# Patient Record
Sex: Female | Born: 1995 | Hispanic: Yes | Marital: Single | Smoking: Former smoker
Health system: Southern US, Community
[De-identification: ages and names within clinical notes are randomized; demographics above are authoritative.]

---
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
## 2016-01-15 ENCOUNTER — Encounter: Payer: BLUE CROSS/BLUE SHIELD | Admitting: Women's Health

## 2016-01-21 ENCOUNTER — Ambulatory Visit: Payer: BLUE CROSS/BLUE SHIELD | Admitting: Gynecology

## 2016-01-22 ENCOUNTER — Ambulatory Visit (INDEPENDENT_AMBULATORY_CARE_PROVIDER_SITE_OTHER): Payer: BLUE CROSS/BLUE SHIELD | Admitting: Gynecology

## 2016-01-22 ENCOUNTER — Encounter: Payer: Self-pay | Admitting: Gynecology

## 2016-01-22 VITALS — BP 112/70

## 2016-01-22 DIAGNOSIS — R350 Frequency of micturition: Secondary | ICD-10-CM | POA: Diagnosis not present

## 2016-01-22 DIAGNOSIS — R300 Dysuria: Secondary | ICD-10-CM

## 2016-01-22 DIAGNOSIS — N3000 Acute cystitis without hematuria: Secondary | ICD-10-CM

## 2016-01-22 DIAGNOSIS — IMO0001 Reserved for inherently not codable concepts without codable children: Secondary | ICD-10-CM

## 2016-01-22 LAB — URINALYSIS W MICROSCOPIC + REFLEX CULTURE
Bilirubin Urine: NEGATIVE
CASTS: NONE SEEN [LPF]
CRYSTALS: NONE SEEN [HPF]
Glucose, UA: NEGATIVE
Ketones, ur: NEGATIVE
Leukocytes, UA: NEGATIVE
Nitrite: NEGATIVE
Specific Gravity, Urine: >1.03 (ref 1.001–1.035)
Yeast: NONE SEEN [HPF]
pH: 6 (ref 5.0–8.0)

## 2016-01-22 MED ORDER — PHENAZOPYRIDINE HCL 200 MG PO TABS: 200.0000 mg | ORAL_TABLET | Freq: Three times a day (TID) | ORAL | Status: DC | PRN

## 2016-01-22 MED ORDER — NITROFURANTOIN MONOHYD MACRO 100 MG PO CAPS: 100.0000 mg | ORAL_CAPSULE | Freq: Two times a day (BID) | ORAL | Status: DC

## 2016-01-22 NOTE — Progress Notes (Signed)
   HPI: 20 year old who for the past few days is reporting frequency in urination but in small quantities. Also accompanied by dysuria. Patient denies any vaginal discharge. Patient denies any back pain, nausea, vomiting, or any GI complaints. Patient sexually active using condoms for contraception reports normal menstrual cycles otherwise.   ROS: A ROS was performed and pertinent positives and negatives are included in the history.  GENERAL: No fevers or chills. HEENT: No change in vision, no earache, sore throat or sinus congestion. NECK: No pain or stiffness. CARDIOVASCULAR: No chest pain or pressure. No palpitations. PULMONARY: No shortness of breath, cough or wheeze. GASTROINTESTINAL: No abdominal pain, nausea, vomiting or diarrhea, melena or bright red blood per rectum. GENITOURINARY: Frequency and dysuria MUSCULOSKELETAL: No joint or muscle pain, no back pain, no recent trauma. DERMATOLOGIC: No rash, no itching, no lesions. ENDOCRINE: No polyuria, polydipsia, no heat or cold intolerance. No recent change in weight. HEMATOLOGICAL: No anemia or easy bruising or bleeding. NEUROLOGIC: No headache, seizures, numbness, tingling or weakness. PSYCHIATRIC: No depression, no loss of interest in normal activity or change in sleep pattern.   PE: Blood pressure 112/70 Gen. appearance well-developed well-nourished female with above-mentioned complaining no acute distress Back: No CVA tenderness Abdomen: Soft nontender no rebound or guarding Pelvic exam not done Rectal exam: Not done  Urinalysis moderate bacteria, 3-10 RBC, 0-5 WBC urine culture submitted   Assessment Plan: 20 year old with clinical signs and symptoms consistent with urinary tract infection she'll be prescribed Macrobid one by mouth twice a day for 7 days. For bladder spasm and dysuria she'll be prescribed Pyridium 200 mg take 1 by mouth 3 times a day for 3 days. Literature information on Macrobid as well as a urinary tract infection) was  provided.    Greater than 50% of time was spent in counseling and coordinating care of this patient. Time of consultation: 15   Minutes.

## 2016-01-22 NOTE — Patient Instructions (Signed)
Phenazopyridine tablets What is this medicine? PHENAZOPYRIDINE (fen az oh PEER i deen) is a pain reliever. It is used to stop the pain, burning, or discomfort caused by infection or irritation of the urinary tract. This medicine is not an antibiotic. It will not cure a urinary tract infection. This medicine may be used for other purposes; ask your health care provider or pharmacist if you have questions. What should I tell my health care provider before I take this medicine? They need to know if you have any of these conditions: -glucose-6-phosphate dehydrogenase (G6PD) deficiency -kidney disease -an unusual or allergic reaction to phenazopyridine, other medicines, foods, dyes, or preservatives -pregnant or trying to get pregnant -breast-feeding How should I use this medicine? Take this medicine by mouth with a glass of water. Follow the directions on the prescription label. Take after meals. Take your doses at regular intervals. Do not take your medicine more often than directed. Do not skip doses or stop your medicine early even if you feel better. Do not stop taking except on your doctor's advice. Talk to your pediatrician regarding the use of this medicine in children. Special care may be needed. Overdosage: If you think you have taken too much of this medicine contact a poison control center or emergency room at once. NOTE: This medicine is only for you. Do not share this medicine with others. What if I miss a dose? If you miss a dose, take it as soon as you can. If it is almost time for your next dose, take only that dose. Do not take double or extra doses. What may interact with this medicine? Interactions are not expected. This list may not describe all possible interactions. Give your health care provider a list of all the medicines, herbs, non-prescription drugs, or dietary supplements you use. Also tell them if you smoke, drink alcohol, or use illegal drugs. Some items may interact  with your medicine. What should I watch for while using this medicine? Tell your doctor or health care professional if your symptoms do not improve or if they get worse. This medicine colors body fluids red. This effect is harmless and will go away after you are done taking the medicine. It will change urine to an dark orange or red color. The red color may stain clothing. Soft contact lenses may become permanently stained. It is best not to wear soft contact lenses while taking this medicine. If you are diabetic you may get a false positive result for sugar in your urine. Talk to your health care provider. What side effects may I notice from receiving this medicine? Side effects that you should report to your doctor or health care professional as soon as possible: -allergic reactions like skin rash, itching or hives, swelling of the face, lips, or tongue -blue or purple color of the skin -difficulty breathing -fever -less urine -unusual bleeding, bruising -unusual tired, weak -vomiting -yellowing of the eyes or skin Side effects that usually do not require medical attention (report to your doctor or health care professional if they continue or are bothersome): -dark urine -headache -stomach upset This list may not describe all possible side effects. Call your doctor for medical advice about side effects. You may report side effects to FDA at 1-800-FDA-1088. Where should I keep my medicine? Keep out of the reach of children. Store at room temperature between 15 and 30 degrees C (59 and 86 degrees F). Protect from light and moisture. Throw away any unused medicine after the   expiration date. NOTE: This sheet is a summary. It may not cover all possible information. If you have questions about this medicine, talk to your doctor, pharmacist, or health care provider.    2016, Elsevier/Gold Standard. (2008-03-30 11:04:07) Nitrofurantoin tablets or capsules What is this medicine? NITROFURANTOIN  (nye troe fyoor AN toyn) is an antibiotic. It is used to treat urinary tract infections. This medicine may be used for other purposes; ask your health care provider or pharmacist if you have questions. What should I tell my health care provider before I take this medicine? They need to know if you have any of these conditions: -anemia -diabetes -glucose-6-phosphate dehydrogenase deficiency -kidney disease -liver disease -lung disease -other chronic illness -an unusual or allergic reaction to nitrofurantoin, other antibiotics, other medicines, foods, dyes or preservatives -pregnant or trying to get pregnant -breast-feeding How should I use this medicine? Take this medicine by mouth with a glass of water. Follow the directions on the prescription label. Take this medicine with food or milk. Take your doses at regular intervals. Do not take your medicine more often than directed. Do not stop taking except on your doctor's advice. Talk to your pediatrician regarding the use of this medicine in children. While this drug may be prescribed for selected conditions, precautions do apply. Overdosage: If you think you have taken too much of this medicine contact a poison control center or emergency room at once. NOTE: This medicine is only for you. Do not share this medicine with others. What if I miss a dose? If you miss a dose, take it as soon as you can. If it is almost time for your next dose, take only that dose. Do not take double or extra doses. What may interact with this medicine? -antacids containing magnesium trisilicate -probenecid -quinolone antibiotics like ciprofloxacin, lomefloxacin, norfloxacin and ofloxacin -sulfinpyrazone This list may not describe all possible interactions. Give your health care provider a list of all the medicines, herbs, non-prescription drugs, or dietary supplements you use. Also tell them if you smoke, drink alcohol, or use illegal drugs. Some items may  interact with your medicine. What should I watch for while using this medicine? Tell your doctor or health care professional if your symptoms do not improve or if you get new symptoms. Drink several glasses of water a day. If you are taking this medicine for a long time, visit your doctor for regular checks on your progress. If you are diabetic, you may get a false positive result for sugar in your urine with certain brands of urine tests. Check with your doctor. What side effects may I notice from receiving this medicine? Side effects that you should report to your doctor or health care professional as soon as possible: -allergic reactions like skin rash or hives, swelling of the face, lips, or tongue -chest pain -cough -difficulty breathing -dizziness, drowsiness -fever or infection -joint aches or pains -pale or blue-tinted skin -redness, blistering, peeling or loosening of the skin, including inside the mouth -tingling, burning, pain, or numbness in hands or feet -unusual bleeding or bruising -unusually weak or tired -yellowing of eyes or skin Side effects that usually do not require medical attention (report to your doctor or health care professional if they continue or are bothersome): -dark urine -diarrhea -headache -loss of appetite -nausea or vomiting -temporary hair loss This list may not describe all possible side effects. Call your doctor for medical advice about side effects. You may report side effects to FDA at 1-800-FDA-1088. Where   should I keep my medicine? Keep out of the reach of children. Store at room temperature between 15 and 30 degrees C (59 and 86 degrees F). Protect from light. Throw away any unused medicine after the expiration date. NOTE: This sheet is a summary. It may not cover all possible information. If you have questions about this medicine, talk to your doctor, pharmacist, or health care provider.    2016, Elsevier/Gold Standard. (2008-03-22  15:56:47) Urinary Tract Infection Urinary tract infections (UTIs) can develop anywhere along your urinary tract. Your urinary tract is your body's drainage system for removing wastes and extra water. Your urinary tract includes two kidneys, two ureters, a bladder, and a urethra. Your kidneys are a pair of bean-shaped organs. Each kidney is about the size of your fist. They are located below your ribs, one on each side of your spine. CAUSES Infections are caused by microbes, which are microscopic organisms, including fungi, viruses, and bacteria. These organisms are so small that they can only be seen through a microscope. Bacteria are the microbes that most commonly cause UTIs. SYMPTOMS  Symptoms of UTIs may vary by age and gender of the patient and by the location of the infection. Symptoms in young women typically include a frequent and intense urge to urinate and a painful, burning feeling in the bladder or urethra during urination. Older women and men are more likely to be tired, shaky, and weak and have muscle aches and abdominal pain. A fever may mean the infection is in your kidneys. Other symptoms of a kidney infection include pain in your back or sides below the ribs, nausea, and vomiting. DIAGNOSIS To diagnose a UTI, your caregiver will ask you about your symptoms. Your caregiver will also ask you to provide a urine sample. The urine sample will be tested for bacteria and white blood cells. White blood cells are made by your body to help fight infection. TREATMENT  Typically, UTIs can be treated with medication. Because most UTIs are caused by a bacterial infection, they usually can be treated with the use of antibiotics. The choice of antibiotic and length of treatment depend on your symptoms and the type of bacteria causing your infection. HOME CARE INSTRUCTIONS  If you were prescribed antibiotics, take them exactly as your caregiver instructs you. Finish the medication even if you feel  better after you have only taken some of the medication.  Drink enough water and fluids to keep your urine clear or pale yellow.  Avoid caffeine, tea, and carbonated beverages. They tend to irritate your bladder.  Empty your bladder often. Avoid holding urine for long periods of time.  Empty your bladder before and after sexual intercourse.  After a bowel movement, women should cleanse from front to back. Use each tissue only once. SEEK MEDICAL CARE IF:   You have back pain.  You develop a fever.  Your symptoms do not begin to resolve within 3 days. SEEK IMMEDIATE MEDICAL CARE IF:   You have severe back pain or lower abdominal pain.  You develop chills.  You have nausea or vomiting.  You have continued burning or discomfort with urination. MAKE SURE YOU:   Understand these instructions.  Will watch your condition.  Will get help right away if you are not doing well or get worse.   This information is not intended to replace advice given to you by your health care provider. Make sure you discuss any questions you have with your health care provider.     Document Released: 06/11/2005 Document Revised: 05/23/2015 Document Reviewed: 10/10/2011 Elsevier Interactive Patient Education 2016 Elsevier Inc.  

## 2016-01-24 LAB — URINE CULTURE
COLONY COUNT: NO GROWTH
Organism ID, Bacteria: NO GROWTH

## 2016-03-03 ENCOUNTER — Encounter: Payer: Self-pay | Admitting: Women's Health

## 2016-06-27 ENCOUNTER — Encounter: Payer: Self-pay | Admitting: Women's Health

## 2016-06-27 ENCOUNTER — Ambulatory Visit (INDEPENDENT_AMBULATORY_CARE_PROVIDER_SITE_OTHER): Payer: BLUE CROSS/BLUE SHIELD | Admitting: Women's Health

## 2016-06-27 VITALS — BP 110/80 | Ht 63.0 in | Wt 94.0 lb

## 2016-06-27 DIAGNOSIS — Z23 Encounter for immunization: Secondary | ICD-10-CM

## 2016-06-27 DIAGNOSIS — N898 Other specified noninflammatory disorders of vagina: Secondary | ICD-10-CM

## 2016-06-27 DIAGNOSIS — B9689 Other specified bacterial agents as the cause of diseases classified elsewhere: Secondary | ICD-10-CM | POA: Diagnosis not present

## 2016-06-27 DIAGNOSIS — N760 Acute vaginitis: Secondary | ICD-10-CM | POA: Diagnosis not present

## 2016-06-27 DIAGNOSIS — Z30011 Encounter for initial prescription of contraceptive pills: Secondary | ICD-10-CM | POA: Diagnosis not present

## 2016-06-27 DIAGNOSIS — Z113 Encounter for screening for infections with a predominantly sexual mode of transmission: Secondary | ICD-10-CM | POA: Diagnosis not present

## 2016-06-27 LAB — WET PREP FOR TRICH, YEAST, CLUE
Trich, Wet Prep: NONE SEEN
WBC, Wet Prep HPF POC: NONE SEEN
Yeast Wet Prep HPF POC: NONE SEEN

## 2016-06-27 MED ORDER — DROSPIRENONE-ETHINYL ESTRADIOL 3-0.02 MG PO TABS: 1.0000 | ORAL_TABLET | Freq: Every day | ORAL | 0 refills | Status: DC

## 2016-06-27 MED ORDER — METRONIDAZOLE 0.75 % VA GEL: VAGINAL | 0 refills | Status: DC

## 2016-06-27 NOTE — Patient Instructions (Signed)

## 2016-06-27 NOTE — Progress Notes (Signed)
Presents with complaint of vaginal discharge with itching and irregular bleeding. Withdrawal, occasional condoms. One partner for 1 year. First gardasil given one year ago. Denies urinary symptoms, abdominal pain or fever. History of positive GC/Chlamydia with negative test of cure. History of anxiety, moved [place] from [place] was living with in and now living with her boyfriend's family.  Exam: Appears well. External genitalia within normal limits, speculum exam scant bloody discharge, GC/Chlamydia culture taken. Wet prep positive for moderate clues, TNTC bacteria. Bimanual uterus small, no CMT or adnexal tenderness.  Bacteria vaginosis Contraception management STD screen  Plan: MetroGel vaginal cream 1 applicator at bedtime 5, alcohol precautions reviewed, instructed to call if continued discharge. GC/Chlamydia culture taken, HIV, hep B, C, RPR. Second gardasil given instructed to return to office in 3 months for third Gardasil and annual exam. Contraception options reviewed, Yaz prescription, slight risk for blood clots and strokes reviewed. Start up instructions reviewed, continue condoms/withdrawal until after first month.

## 2016-06-28 LAB — HEPATITIS B SURFACE ANTIGEN: Hepatitis B Surface Ag: NEGATIVE

## 2016-06-28 LAB — RPR

## 2016-06-28 LAB — GC/CHLAMYDIA PROBE AMP
CT Probe RNA: NOT DETECTED
GC Probe RNA: NOT DETECTED

## 2016-06-28 LAB — HEPATITIS C ANTIBODY: HCV AB: NEGATIVE

## 2016-06-28 LAB — HIV ANTIBODY (ROUTINE TESTING W REFLEX): HIV 1&2 Ab, 4th Generation: NONREACTIVE

## 2016-07-01 ENCOUNTER — Telehealth: Payer: Self-pay | Admitting: *Deleted

## 2016-07-01 NOTE — Telephone Encounter (Signed)
Pt informed with the negative STD results on 06/27/16

## 2016-07-11 ENCOUNTER — Encounter: Payer: BLUE CROSS/BLUE SHIELD | Admitting: Women's Health

## 2016-09-02 ENCOUNTER — Encounter: Payer: BLUE CROSS/BLUE SHIELD | Admitting: Women's Health

## 2017-01-28 ENCOUNTER — Encounter: Payer: Self-pay | Admitting: Gynecology

## 2017-06-22 ENCOUNTER — Encounter: Payer: BLUE CROSS/BLUE SHIELD | Admitting: Women's Health

## 2017-06-22 DIAGNOSIS — Z0289 Encounter for other administrative examinations: Secondary | ICD-10-CM

## 2017-10-30 ENCOUNTER — Encounter: Payer: Self-pay | Admitting: Gynecology

## 2017-10-30 ENCOUNTER — Ambulatory Visit (INDEPENDENT_AMBULATORY_CARE_PROVIDER_SITE_OTHER): Payer: BLUE CROSS/BLUE SHIELD | Admitting: Gynecology

## 2017-10-30 VITALS — BP 116/74

## 2017-10-30 DIAGNOSIS — Z3009 Encounter for other general counseling and advice on contraception: Secondary | ICD-10-CM | POA: Diagnosis not present

## 2017-10-30 DIAGNOSIS — N760 Acute vaginitis: Secondary | ICD-10-CM | POA: Diagnosis not present

## 2017-10-30 DIAGNOSIS — B9689 Other specified bacterial agents as the cause of diseases classified elsewhere: Secondary | ICD-10-CM | POA: Diagnosis not present

## 2017-10-30 LAB — WET PREP FOR TRICH, YEAST, CLUE

## 2017-10-30 LAB — PREGNANCY, URINE: Preg Test, Ur: NEGATIVE

## 2017-10-30 MED ORDER — CLINDAMYCIN PHOSPHATE 2 % VA CREA: 1.0000 | TOPICAL_CREAM | Freq: Every day | VAGINAL | 0 refills | Status: DC

## 2017-10-30 NOTE — Addendum Note (Signed)
Addended by: Dayna BarkerGARDNER, KIMBERLY K on: 10/30/2017 12:52 PM   Modules accepted: Orders

## 2017-10-30 NOTE — Progress Notes (Signed)
    Destiny Webb 1996/07/31 [id number]        22 y.o.  G0P0000 presents complaining of vaginal discharge, irritation and odor.  Feels that she has bacterial vaginosis.  Symptoms have been going on for about 2 months and seem to be getting worse.  No urinary symptoms such as frequency dysuria urgency low back pain fever or chills.  No nausea vomiting diarrhea constipation.  Offered STD screening and this was declined.  Past medical history,surgical history, problem list, medications, allergies, family history and social history were all reviewed and documented in the EPIC chart.  Directed ROS with pertinent positives and negatives documented in the history of present illness/assessment and plan.  Exam: Kennon PortelaKim Gardner assistant Vitals:   10/30/17 1228  BP: 116/74   General appearance:  Normal Abdomen soft nontender without masses guarding rebound Pelvic external BUS vagina with white discharge.  Cervix normal.  Uterus normal size midline mobile nontender.  Adnexa without masses or tenderness  Assessment/Plan:  21 y.o. G0P0000 with history and exam as above.  Is due for menses in the next day or 2.  UPT was run today and negative.  Currently not using contraception.  Wet prep consistent with bacterial vaginosis.  Options for treatment reviewed and she elects for Cleocin vaginal cream nightly x7 nights.  We will follow-up if her symptoms persist, worsen or recur.  We discussed contraceptive options and she is leaning towards Mirena IUD.  I reviewed the insertional process with her risks versus benefits and side effect profile.  Literature was provided and she is going to review this and follow-up with her decision.  Greater than 50% of my time was spent in direct face to face counseling and coordination of care with the patient.     Dara Lordsimothy P Kuron Docken MD, 12:37 PM 10/30/2017

## 2017-10-30 NOTE — Patient Instructions (Signed)
Use the vaginal cream nightly for 7 nights.  Follow-up if your discharge or irritation continues.  Follow-up with your contraceptive decision.

## 2017-11-18 ENCOUNTER — Encounter: Payer: BLUE CROSS/BLUE SHIELD | Admitting: Women's Health

## 2018-01-27 ENCOUNTER — Encounter: Payer: Self-pay | Admitting: Women's Health

## 2018-01-27 ENCOUNTER — Ambulatory Visit (INDEPENDENT_AMBULATORY_CARE_PROVIDER_SITE_OTHER): Payer: BLUE CROSS/BLUE SHIELD | Admitting: Women's Health

## 2018-01-27 VITALS — BP 118/70 | Ht 63.0 in | Wt 104.0 lb

## 2018-01-27 DIAGNOSIS — Z23 Encounter for immunization: Secondary | ICD-10-CM | POA: Diagnosis not present

## 2018-01-27 DIAGNOSIS — N898 Other specified noninflammatory disorders of vagina: Secondary | ICD-10-CM | POA: Diagnosis not present

## 2018-01-27 DIAGNOSIS — Z01419 Encounter for gynecological examination (general) (routine) without abnormal findings: Secondary | ICD-10-CM

## 2018-01-27 DIAGNOSIS — Z113 Encounter for screening for infections with a predominantly sexual mode of transmission: Secondary | ICD-10-CM | POA: Diagnosis not present

## 2018-01-27 LAB — WET PREP FOR TRICH, YEAST, CLUE

## 2018-01-27 MED ORDER — ETONOGESTREL-ETHINYL ESTRADIOL 0.12-0.015 MG/24HR VA RING: VAGINAL_RING | VAGINAL | 4 refills | Status: DC

## 2018-01-27 NOTE — Progress Notes (Signed)
Destiny Webb Junio 02-Oct-1995 [id number]    History:    Presents for annual exam.  Regular monthly cycle/withdrawal same partner for 3 years.  History of positive GC and chlamydia in 01-22-2015.  History of substance abuse but reports occasional smoking weed only now.  Received 2 Gardasil in the past.  Past medical history, past surgical history, family history and social history were all reviewed and documented in the EPIC chart.  Moved here from [place] currently living with her aunt who is moving back to [place].  Boyfriend is living at the home.  Father died 2015/01/22.  Quit high school.  ROS:  A ROS was performed and pertinent positives and negatives are included.  Exam:  Vitals:   01/27/18 1057  BP: 118/70  Weight: 104 lb (47.2 kg)  Height:  (1.6 m)   Body mass index is 18.42 kg/m.   General appearance:  Normal Thyroid:  Symmetrical, normal in size, without palpable masses or nodularity. Respiratory  Auscultation:  Clear without wheezing or rhonchi Cardiovascular  Auscultation:  Regular rate, without rubs, murmurs or gallops  Edema/varicosities:  Not grossly evident Abdominal  Soft,nontender, without masses, guarding or rebound.  Liver/spleen:  No organomegaly noted  Hernia:  None appreciated  Skin  Inspection:  Grossly normal   Breasts: Examined lying and sitting.     Right: Without masses, retractions, discharge or axillary adenopathy.     Left: Without masses, retractions, discharge or axillary adenopathy. Gentitourinary   Inguinal/mons:  Normal without inguinal adenopathy  External genitalia:  Normal  BUS/Urethra/Skene's glands:  Normal  Vagina:  Normal  Cervix:  Normal  Uterus: normal in size, shape and contour.  Midline and mobile  Adnexa/parametria:     Rt: Without masses or tenderness.   Lt: Without masses or tenderness.  Anus and perineum: Normal    Assessment/Plan:  22 y.o. S WF G0 for annual exam with no complaints.  Contraception management History  of positive GC/chlamydia.  Plan: Third Gardasil given.  Contraception options reviewed, will try NuvaRing prescription, proper use, start up instructions reviewed.  Reviewed importance of continuing withdrawal with first month on NuvaRing.  SBEs, exercise, calcium rich foods, MVI daily encouraged.  Reviewed importance of no unprescribed drugs.  Safe driving and lifestyle reviewed.  Encouraged to pursue GED.  CBC, GC/chlamydia, denies need for STD screen.    Harrington Challenger [facility], 1:14 PM 01/27/2018

## 2018-01-27 NOTE — Patient Instructions (Signed)
Health Maintenance, Female Adopting a healthy lifestyle and getting preventive care can go a long way to promote health and wellness. Talk with your health care provider about what schedule of regular examinations is right for you. This is a good chance for you to check in with your provider about disease prevention and staying healthy. In between checkups, there are plenty of things you can do on your own. Experts have done a lot of research about which lifestyle changes and preventive measures are most likely to keep you healthy. Ask your health care provider for more information. Weight and diet Eat a healthy diet  Be sure to include plenty of vegetables, fruits, low-fat dairy products, and lean protein.  Do not eat a lot of foods high in solid fats, added sugars, or salt.  Get regular exercise. This is one of the most important things you can do for your health. ? Most adults should exercise for at least 150 minutes each week. The exercise should increase your heart rate and make you sweat (moderate-intensity exercise). ? Most adults should also do strengthening exercises at least twice a week. This is in addition to the moderate-intensity exercise.  Maintain a healthy weight  Body mass index (BMI) is a measurement that can be used to identify possible weight problems. It estimates body fat based on height and weight. Your health care provider can help determine your BMI and help you achieve or maintain a healthy weight.  For females 20 years of age and older: ? A BMI below 18.5 is considered underweight. ? A BMI of 18.5 to 24.9 is normal. ? A BMI of 25 to 29.9 is considered overweight. ? A BMI of 30 and above is considered obese.  Watch levels of cholesterol and blood lipids  You should start having your blood tested for lipids and cholesterol at 20 years of age, then have this test every 5 years.  You may need to have your cholesterol levels checked more often if: ? Your lipid or  cholesterol levels are high. ? You are older than 21 years of age. ? You are at high risk for heart disease.  Cancer screening Lung Cancer  Lung cancer screening is recommended for adults 55-80 years old who are at high risk for lung cancer because of a history of smoking.  A yearly low-dose CT scan of the lungs is recommended for people who: ? Currently smoke. ? Have quit within the past 15 years. ? Have at least a 30-pack-year history of smoking. A pack year is smoking an average of one pack of cigarettes a day for 1 year.  Yearly screening should continue until it has been 15 years since you quit.  Yearly screening should stop if you develop a health problem that would prevent you from having lung cancer treatment.  Breast Cancer  Practice breast self-awareness. This means understanding how your breasts normally appear and feel.  It also means doing regular breast self-exams. Let your health care provider know about any changes, no matter how small.  If you are in your 20s or 30s, you should have a clinical breast exam (CBE) by a health care provider every 1-3 years as part of a regular health exam.  If you are 40 or older, have a CBE every year. Also consider having a breast X-ray (mammogram) every year.  If you have a family history of breast cancer, talk to your health care provider about genetic screening.  If you are at high risk   for breast cancer, talk to your health care provider about having an MRI and a mammogram every year.  Breast cancer gene (BRCA) assessment is recommended for women who have family members with BRCA-related cancers. BRCA-related cancers include: ? Breast. ? Ovarian. ? Tubal. ? Peritoneal cancers.  Results of the assessment will determine the need for genetic counseling and BRCA1 and BRCA2 testing.  Cervical Cancer Your health care provider may recommend that you be screened regularly for cancer of the pelvic organs (ovaries, uterus, and  vagina). This screening involves a pelvic examination, including checking for microscopic changes to the surface of your cervix (Pap test). You may be encouraged to have this screening done every 3 years, beginning at age 22.  For women ages 56-65, health care providers may recommend pelvic exams and Pap testing every 3 years, or they may recommend the Pap and pelvic exam, combined with testing for human papilloma virus (HPV), every 5 years. Some types of HPV increase your risk of cervical cancer. Testing for HPV may also be done on women of any age with unclear Pap test results.  Other health care providers may not recommend any screening for nonpregnant women who are considered low risk for pelvic cancer and who do not have symptoms. Ask your health care provider if a screening pelvic exam is right for you.  If you have had past treatment for cervical cancer or a condition that could lead to cancer, you need Pap tests and screening for cancer for at least 20 years after your treatment. If Pap tests have been discontinued, your risk factors (such as having a new sexual partner) need to be reassessed to determine if screening should resume. Some women have medical problems that increase the chance of getting cervical cancer. In these cases, your health care provider may recommend more frequent screening and Pap tests.  Colorectal Cancer  This type of cancer can be detected and often prevented.  Routine colorectal cancer screening usually begins at 21 years of age and continues through 21 years of age.  Your health care provider may recommend screening at an earlier age if you have risk factors for colon cancer.  Your health care provider may also recommend using home test kits to check for hidden blood in the stool.  A small camera at the end of a tube can be used to examine your colon directly (sigmoidoscopy or colonoscopy). This is done to check for the earliest forms of colorectal  cancer.  Routine screening usually begins at age 33.  Direct examination of the colon should be repeated every 5-10 years through 21 years of age. However, you may need to be screened more often if early forms of precancerous polyps or small growths are found.  Skin Cancer  Check your skin from head to toe regularly.  Tell your health care provider about any new moles or changes in moles, especially if there is a change in a mole's shape or color.  Also tell your health care provider if you have a mole that is larger than the size of a pencil eraser.  Always use sunscreen. Apply sunscreen liberally and repeatedly throughout the day.  Protect yourself by wearing long sleeves, pants, a wide-brimmed hat, and sunglasses whenever you are outside.  Heart disease, diabetes, and high blood pressure  High blood pressure causes heart disease and increases the risk of stroke. High blood pressure is more likely to develop in: ? People who have blood pressure in the high end of  the normal range (130-139/85-89 mm Hg). ? People who are overweight or obese. ? People who are African American.  If you are 21-29 years of age, have your blood pressure checked every 3-5 years. If you are 3 years of age or older, have your blood pressure checked every year. You should have your blood pressure measured twice-once when you are at a hospital or clinic, and once when you are not at a hospital or clinic. Record the average of the two measurements. To check your blood pressure when you are not at a hospital or clinic, you can use: ? An automated blood pressure machine at a pharmacy. ? A home blood pressure monitor.  If you are between 17 years and 37 years old, ask your health care provider if you should take aspirin to prevent strokes.  Have regular diabetes screenings. This involves taking a blood sample to check your fasting blood sugar level. ? If you are at a normal weight and have a low risk for diabetes,  have this test once every three years after 21 years of age. ? If you are overweight and have a high risk for diabetes, consider being tested at a younger age or more often. Preventing infection Hepatitis B  If you have a higher risk for hepatitis B, you should be screened for this virus. You are considered at high risk for hepatitis B if: ? You were born in a country where hepatitis B is common. Ask your health care provider which countries are considered high risk. ? Your parents were born in a high-risk country, and you have not been immunized against hepatitis B (hepatitis B vaccine). ? You have HIV or AIDS. ? You use needles to inject street drugs. ? You live with someone who has hepatitis B. ? You have had sex with someone who has hepatitis B. ? You get hemodialysis treatment. ? You take certain medicines for conditions, including cancer, organ transplantation, and autoimmune conditions.  Hepatitis C  Blood testing is recommended for: ? Everyone born from [birth year] through 1964. ? Anyone with known risk factors for hepatitis C.  Sexually transmitted infections (STIs)  You should be screened for sexually transmitted infections (STIs) including gonorrhea and chlamydia if: ? You are sexually active and are younger than 21 years of age. ? You are older than 21 years of age and your health care provider tells you that you are at risk for this type of infection. ? Your sexual activity has changed since you were last screened and you are at an increased risk for chlamydia or gonorrhea. Ask your health care provider if you are at risk.  If you do not have HIV, but are at risk, it may be recommended that you take a prescription medicine daily to prevent HIV infection. This is called pre-exposure prophylaxis (PrEP). You are considered at risk if: ? You are sexually active and do not regularly use condoms or know the HIV status of your partner(s). ? You take drugs by injection. ? You are  sexually active with a partner who has HIV.  Talk with your health care provider about whether you are at high risk of being infected with HIV. If you choose to begin PrEP, you should first be tested for HIV. You should then be tested every 3 months for as long as you are taking PrEP. Pregnancy  If you are premenopausal and you may become pregnant, ask your health care provider about preconception counseling.  If you may become  pregnant, take 400 to 800 micrograms (mcg) of folic acid every day.  If you want to prevent pregnancy, talk to your health care provider about birth control (contraception). Osteoporosis and menopause  Osteoporosis is a disease in which the bones lose minerals and strength with aging. This can result in serious bone fractures. Your risk for osteoporosis can be identified using a bone density scan.  If you are 65 years of age or older, or if you are at risk for osteoporosis and fractures, ask your health care provider if you should be screened.  Ask your health care provider whether you should take a calcium or vitamin D supplement to lower your risk for osteoporosis.  Menopause may have certain physical symptoms and risks.  Hormone replacement therapy may reduce some of these symptoms and risks. Talk to your health care provider about whether hormone replacement therapy is right for you. Follow these instructions at home:  Schedule regular health, dental, and eye exams.  Stay current with your immunizations.  Do not use any tobacco products including cigarettes, chewing tobacco, or electronic cigarettes.  If you are pregnant, do not drink alcohol.  If you are breastfeeding, limit how much and how often you drink alcohol.  Limit alcohol intake to no more than 1 drink per day for nonpregnant women. One drink equals 12 ounces of beer, 5 ounces of wine, or 1 ounces of hard liquor.  Do not use street drugs.  Do not share needles.  Ask your health care  provider for help if you need support or information about quitting drugs.  Tell your health care provider if you often feel depressed.  Tell your health care provider if you have ever been abused or do not feel safe at home. This information is not intended to replace advice given to you by your health care provider. Make sure you discuss any questions you have with your health care provider. Document Released: 03/17/2011 Document Revised: 02/07/2016 Document Reviewed: 06/05/2015 Elsevier Interactive Patient Education  2018 Elsevier Inc.   Ethinyl Estradiol; Etonogestrel vaginal ring What is this medicine? ETHINYL ESTRADIOL; ETONOGESTREL (ETH in il es tra DYE ole; et oh noe JES trel) vaginal ring is a flexible, vaginal ring used as a contraceptive (birth control method). This medicine combines two types of female hormones, an estrogen and a progestin. This ring is used to prevent ovulation and pregnancy. Each ring is effective for one month. This medicine may be used for other purposes; ask your health care provider or pharmacist if you have questions. COMMON BRAND NAME(S): NuvaRing What should I tell my health care provider before I take this medicine? They need to know if you have or ever had any of these conditions: -abnormal vaginal bleeding -blood vessel disease or blood clots -breast, cervical, endometrial, ovarian, liver, or uterine cancer -diabetes -gallbladder disease -heart disease or recent heart attack -high blood pressure -high cholesterol -kidney disease -liver disease -migraine headaches -stroke -systemic lupus erythematosus (SLE) -tobacco smoker -an unusual or allergic reaction to estrogens, progestins, other medicines, foods, dyes, or preservatives -pregnant or trying to get pregnant -breast-feeding How should I use this medicine? Insert the ring into your vagina as directed. Follow the directions on the prescription label. The ring will remain place for 3 weeks  and is then removed for a 1-week break. A new ring is inserted 1 week after the last ring was removed, on the same day of the week. Check often to make sure the ring is still in place,   and after sexual intercourse. If the ring was out of the vagina for an unknown amount of time, you may not be protected from pregnancy. Perform a pregnancy test and call your doctor. Do not use more often than directed. A patient package insert for the product will be given with each prescription and refill. Read this sheet carefully each time. The sheet may change frequently. Contact your pediatrician regarding the use of this medicine in children. Special care may be needed. This medicine has been used in female children who have started having menstrual periods. Overdosage: If you think you have taken too much of this medicine contact a poison control center or emergency room at once. NOTE: This medicine is only for you. Do not share this medicine with others. What if I miss a dose? You will need to replace your vaginal ring once a month as directed. If the ring should slip out, or if you leave it in longer or shorter than you should, contact your health care professional for advice. What may interact with this medicine? Do not take this medicine with the following medication: -dasabuvir; ombitasvir; paritaprevir; ritonavir -ombitasvir; paritaprevir; ritonavir This medicine may also interact with the following medications: -acetaminophen -antibiotics or medicines for infections, especially rifampin, rifabutin, rifapentine, and griseofulvin, and possibly penicillins or tetracyclines -aprepitant -ascorbic acid (vitamin C) -atorvastatin -barbiturate medicines, such as phenobarbital -bosentan -carbamazepine -caffeine -clofibrate -cyclosporine -dantrolene -doxercalciferol -felbamate -grapefruit juice -hydrocortisone -medicines for anxiety or sleeping problems, such as diazepam or  temazepam -medicines for diabetes, including pioglitazone -modafinil -mycophenolate -nefazodone -oxcarbazepine -phenytoin -prednisolone -ritonavir or other medicines for HIV infection or AIDS -rosuvastatin -selegiline -soy isoflavones supplements -St. John's wort -tamoxifen or raloxifene -theophylline -thyroid hormones -topiramate -warfarin This list may not describe all possible interactions. Give your health care provider a list of all the medicines, herbs, non-prescription drugs, or dietary supplements you use. Also tell them if you smoke, drink alcohol, or use illegal drugs. Some items may interact with your medicine. What should I watch for while using this medicine? Visit your doctor or health care professional for regular checks on your progress. You will need a regular breast and pelvic exam and Pap smear while on this medicine. Use an additional method of contraception during the first cycle that you use this ring. Do not use a diaphragm or female condom, as the ring can interfere with these birth control methods and their proper placement. If you have any reason to think you are pregnant, stop using this medicine right away and contact your doctor or health care professional. If you are using this medicine for hormone related problems, it may take several cycles of use to see improvement in your condition. Smoking increases the risk of getting a blood clot or having a stroke while you are using hormonal birth control, especially if you are more than 21 years old. You are strongly advised not to smoke. This medicine can make your body retain fluid, making your fingers, hands, or ankles swell. Your blood pressure can go up. Contact your doctor or health care professional if you feel you are retaining fluid. This medicine can make you more sensitive to the sun. Keep out of the sun. If you cannot avoid being in the sun, wear protective clothing and use sunscreen. Do not use sun lamps  or tanning beds/booths. If you wear contact lenses and notice visual changes, or if the lenses begin to feel uncomfortable, consult your eye care specialist. In some women, tenderness, swelling, or minor  or minor bleeding of the gums may occur. Notify your dentist if this happens. Brushing and flossing your teeth regularly may help limit this. See your dentist regularly and inform your dentist of the medicines you are taking. If you are going to have elective surgery, you may need to stop using this medicine before the surgery. Consult your health care professional for advice. This medicine does not protect you against HIV infection (AIDS) or any other sexually transmitted diseases. What side effects may I notice from receiving this medicine? Side effects that you should report to your doctor or health care professional as soon as possible: -breast tissue changes or discharge -changes in vaginal bleeding during your period or between your periods -chest pain -coughing up blood -dizziness or fainting spells -headaches or migraines -leg, arm or groin pain -severe or sudden headaches -stomach pain (severe) -sudden shortness of breath -sudden loss of coordination, especially on one side of the body -speech problems -symptoms of vaginal infection like itching, irritation or unusual discharge -tenderness in the upper abdomen -vomiting -weakness or numbness in the arms or legs, especially on one side of the body -yellowing of the eyes or skin Side effects that usually do not require medical attention (report to your doctor or health care professional if they continue or are bothersome): -breakthrough bleeding and spotting that continues beyond the 3 initial cycles of pills -breast tenderness -mood changes, anxiety, depression, frustration, anger, or emotional outbursts -increased sensitivity to sun or ultraviolet light -nausea -skin rash, acne, or brown spots on the skin -weight gain (slight) This  list may not describe all possible side effects. Call your doctor for medical advice about side effects. You may report side effects to FDA at 1-800-FDA-1088. Where should I keep my medicine? Keep out of the reach of children. Store at room temperature between 15 and 30 degrees C (59 and 86 degrees F) for up to 4 months. The product will expire after 4 months. Protect from light. Throw away any unused medicine after the expiration date. NOTE: This sheet is a summary. It may not cover all possible information. If you have questions about this medicine, talk to your doctor, pharmacist, or health care provider.  2018 Elsevier/Gold Standard (2016-05-09 17:00:31)  

## 2018-01-28 LAB — C. TRACHOMATIS/N. GONORRHOEAE RNA
C. trachomatis RNA, TMA: NOT DETECTED
N. gonorrhoeae RNA, TMA: NOT DETECTED

## 2018-04-30 ENCOUNTER — Ambulatory Visit (HOSPITAL_COMMUNITY)
Admission: EM | Admit: 2018-04-30 | Discharge: 2018-04-30 | Disposition: A | Discharge status: Home / Self Care | Payer: BLUE CROSS/BLUE SHIELD | Attending: Family Medicine | Admitting: Family Medicine

## 2018-04-30 ENCOUNTER — Ambulatory Visit (INDEPENDENT_AMBULATORY_CARE_PROVIDER_SITE_OTHER): Payer: BLUE CROSS/BLUE SHIELD

## 2018-04-30 ENCOUNTER — Encounter (HOSPITAL_COMMUNITY): Payer: Self-pay | Admitting: Family Medicine

## 2018-04-30 DIAGNOSIS — S92515A Nondisplaced fracture of proximal phalanx of left lesser toe(s), initial encounter for closed fracture: Secondary | ICD-10-CM

## 2018-04-30 MED ORDER — HYDROCODONE-ACETAMINOPHEN 5-325 MG PO TABS: 1.0000 | ORAL_TABLET | Freq: Four times a day (QID) | ORAL | 0 refills | Status: DC | PRN

## 2018-04-30 NOTE — ED Triage Notes (Signed)
Pt presents with left toe pain from jamming it in the car door .

## 2018-04-30 NOTE — Discharge Instructions (Signed)
Call orthopedics on Monday for appointment and follow up
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
## 2018-11-25 ENCOUNTER — Ambulatory Visit (HOSPITAL_COMMUNITY)
Admission: EM | Admit: 2018-11-25 | Discharge: 2018-11-25 | Disposition: A | Discharge status: Home / Self Care | Payer: BLUE CROSS/BLUE SHIELD | Attending: Family Medicine | Admitting: Family Medicine

## 2018-11-25 ENCOUNTER — Encounter (HOSPITAL_COMMUNITY): Payer: Self-pay | Admitting: Family Medicine

## 2018-11-25 ENCOUNTER — Other Ambulatory Visit: Payer: Self-pay

## 2018-11-25 DIAGNOSIS — S00439A Contusion of unspecified ear, initial encounter: Secondary | ICD-10-CM

## 2018-11-25 MED ORDER — DICLOFENAC SODIUM 75 MG PO TBEC: 75.0000 mg | DELAYED_RELEASE_TABLET | Freq: Two times a day (BID) | ORAL | 0 refills | Status: AC
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
## 2019-04-03 IMAGING — DX DG FOOT COMPLETE 3+V*L*
3 series · 3 of 3 positions shown · non-contrast
Comparison: None.

CLINICAL DATA: Injury to the left foot with pain

EXAM:
LEFT FOOT - COMPLETE 3+ VIEW

[foot ap]
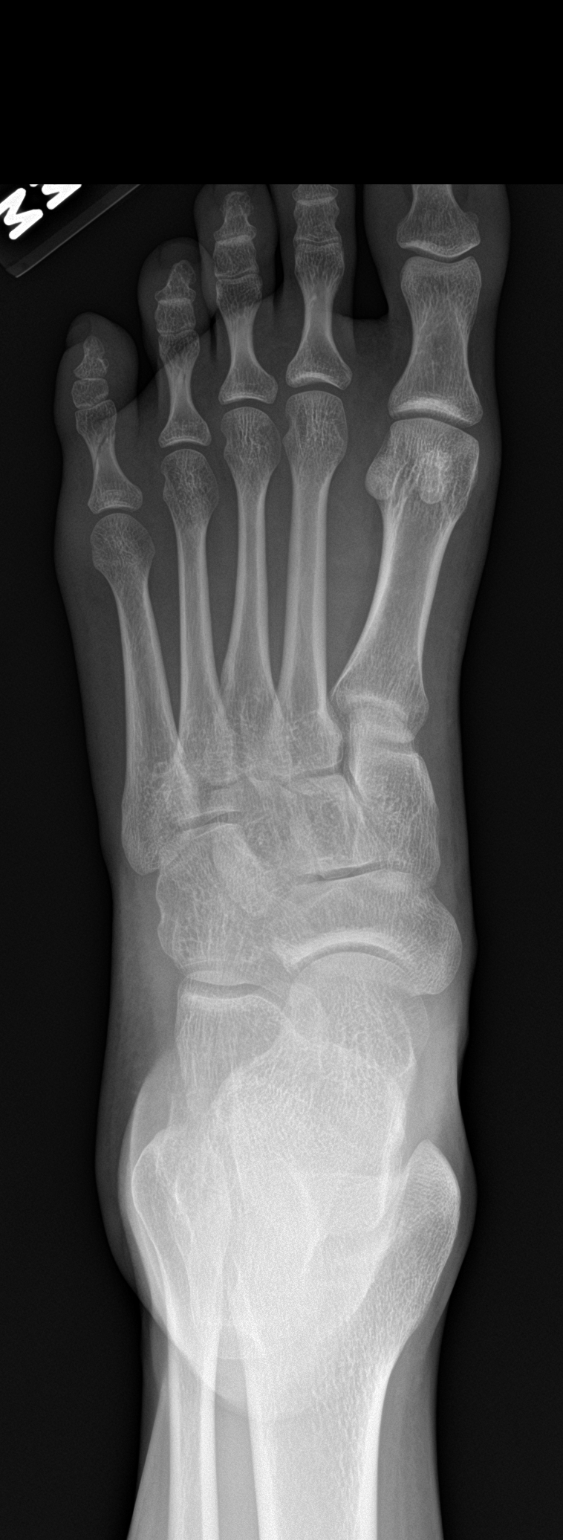

[foot obl]
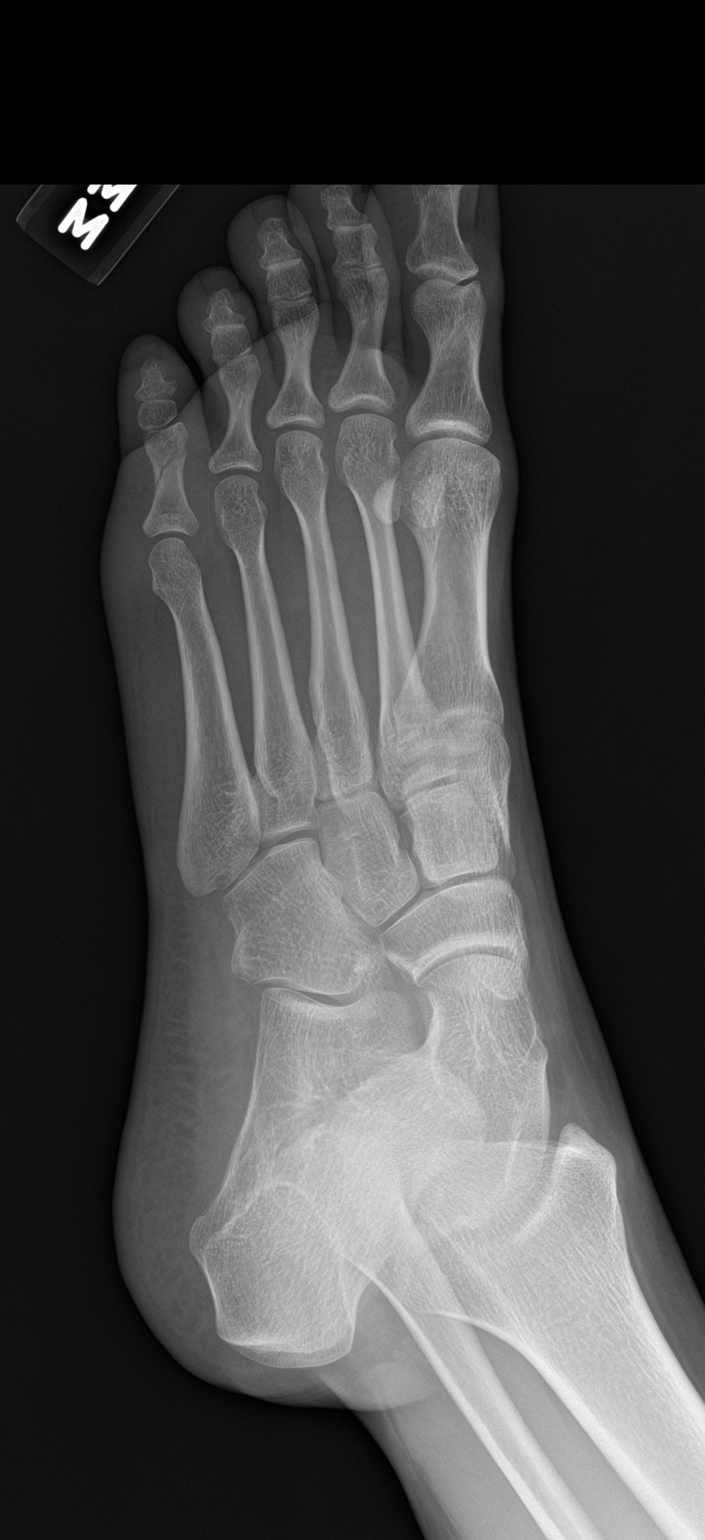

[foot lat]
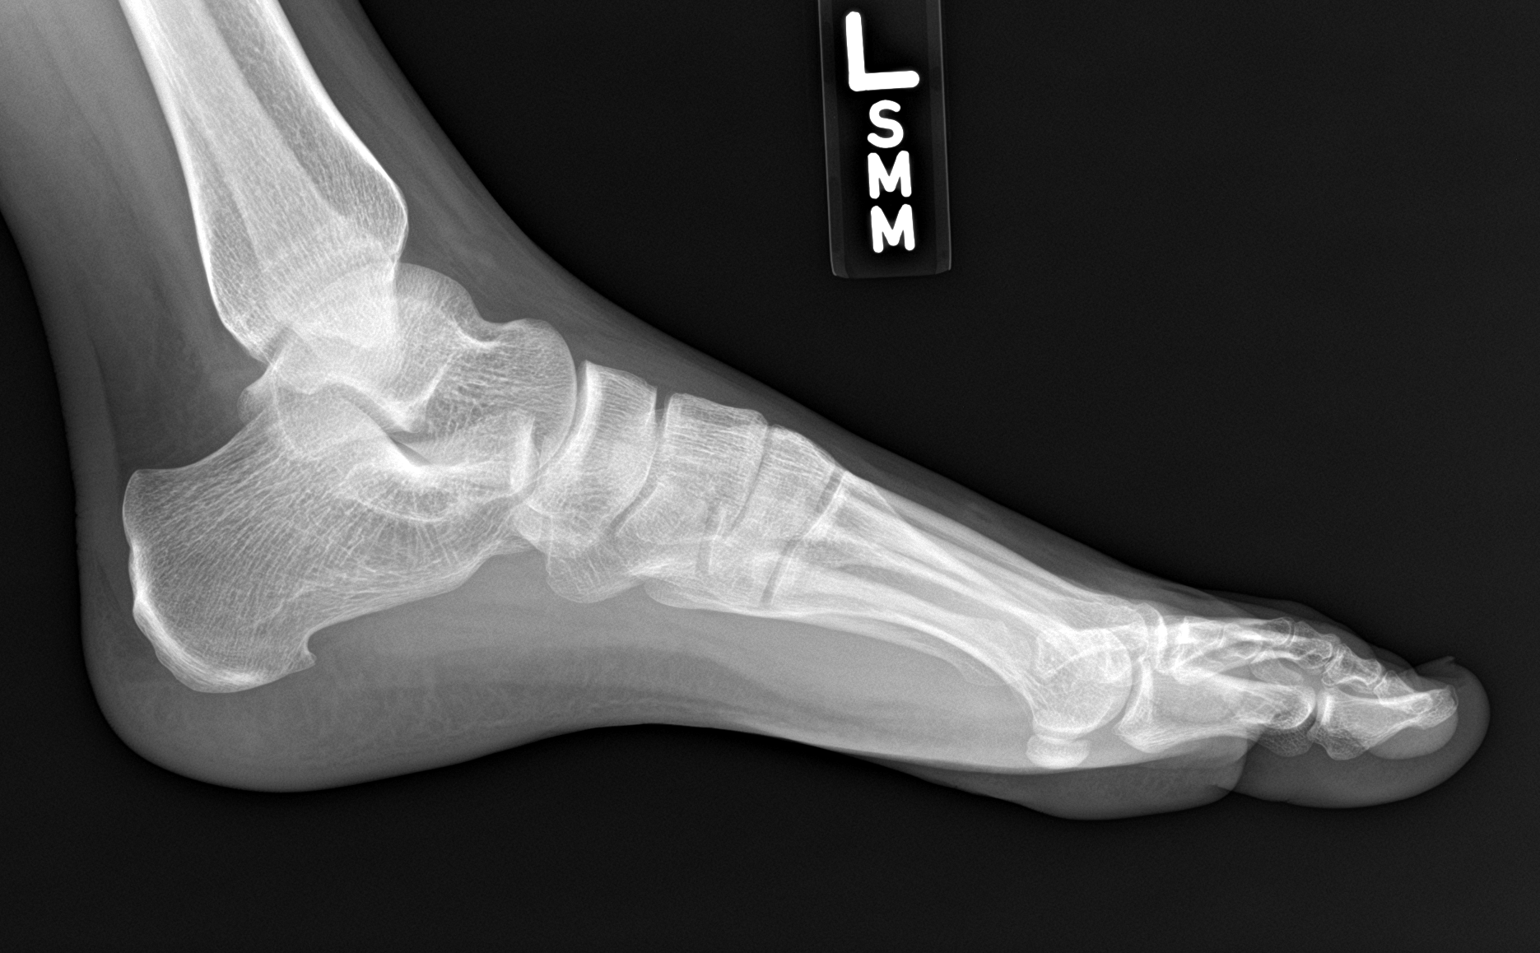

[3 of 3 positions shown; findings below may reference images not displayed]

FINDINGS: Acute nondisplaced fracture involving the mid to distal shaft of the
fifth proximal phalanx. No significant angulation. No subluxation
IMPRESSION: Acute nondisplaced fracture involving the mid to distal aspect of
the fifth proximal phalanx.

## 2019-06-06 ENCOUNTER — Encounter: Payer: Self-pay | Admitting: Gynecology

## 2020-08-07 ENCOUNTER — Emergency Department (HOSPITAL_COMMUNITY)
Admission: EM | Admit: 2020-08-07 | Discharge: 2020-08-08 | Disposition: A | Discharge status: Home / Self Care | Payer: BC Managed Care – PPO | Attending: Emergency Medicine | Admitting: Emergency Medicine

## 2020-08-07 ENCOUNTER — Encounter (HOSPITAL_COMMUNITY): Payer: Self-pay | Admitting: Emergency Medicine

## 2020-08-07 DIAGNOSIS — Y92019 Unspecified place in single-family (private) house as the place of occurrence of the external cause: Secondary | ICD-10-CM | POA: Insufficient documentation

## 2020-08-07 DIAGNOSIS — Z87891 Personal history of nicotine dependence: Secondary | ICD-10-CM | POA: Diagnosis not present

## 2020-08-07 DIAGNOSIS — S61304A Unspecified open wound of right ring finger with damage to nail, initial encounter: Secondary | ICD-10-CM | POA: Diagnosis not present

## 2020-08-07 DIAGNOSIS — S6990XA Unspecified injury of unspecified wrist, hand and finger(s), initial encounter: Secondary | ICD-10-CM | POA: Diagnosis present

## 2020-08-07 DIAGNOSIS — S61309A Unspecified open wound of unspecified finger with damage to nail, initial encounter: Secondary | ICD-10-CM

## 2020-08-07 MED ORDER — LIDOCAINE HCL (PF) 1 % IJ SOLN
30.0000 mL | Freq: Once | INTRAMUSCULAR | Status: AC
  Administered 2020-08-07: 30 mL
  Filled 2020-08-07: qty 30
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
## 2020-08-08 MED ORDER — HYDROCODONE-ACETAMINOPHEN 5-325 MG PO TABS: 1.0000 | ORAL_TABLET | ORAL | 0 refills | Status: AC | PRN

## 2020-08-08 MED ORDER — OXYCODONE HCL 5 MG PO TABS
5.0000 mg | ORAL_TABLET | Freq: Once | ORAL | Status: AC
  Administered 2020-08-08: 5 mg via ORAL
  Filled 2020-08-08: qty 1

## 2020-08-08 NOTE — ED Notes (Signed)
Discharge instructions, pain management, and prescription discussed with patient. Verbalized understanding. Departs ED at this time in stable condition.

## 2020-08-08 NOTE — Discharge Instructions (Signed)
You were evaluated in the Emergency Department and after careful evaluation, we did not find any emergent condition requiring admission or further testing in the hospital.  Your exam/testing today was overall reassuring.  We placed a temporary fingernail today here in the emergency department to treat your fingernail avulsion.  As discussed, if you are concerned about the healing of the fingernail you can follow-up with a hand specialist for further management/correction.  Please return to the Emergency Department if you experience any worsening of your condition.  Thank you for allowing [place] to be a part of your care.
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
